# Patient Record
Sex: Male | Born: 2000 | Race: White | Hispanic: No | Marital: Single | Smoking: Never smoker
Health system: Southern US, Community
[De-identification: ages and names within clinical notes are randomized; demographics above are authoritative.]

## PROBLEM LIST (undated history)

## (undated) DIAGNOSIS — R010 Benign and innocent cardiac murmurs: Secondary | ICD-10-CM

## (undated) DIAGNOSIS — F900 Attention-deficit hyperactivity disorder, predominantly inattentive type: Secondary | ICD-10-CM

## (undated) DIAGNOSIS — F913 Oppositional defiant disorder: Secondary | ICD-10-CM

## (undated) HISTORY — DX: Oppositional defiant disorder: F91.3

## (undated) HISTORY — PX: CLAVICLE SURGERY: SHX598

## (undated) HISTORY — DX: Benign and innocent cardiac murmurs: R01.0

## (undated) HISTORY — DX: Attention-deficit hyperactivity disorder, predominantly inattentive type: F90.0

---
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
## 2021-07-24 ENCOUNTER — Other Ambulatory Visit: Payer: Self-pay

## 2021-07-24 DIAGNOSIS — R000 Tachycardia, unspecified: Secondary | ICD-10-CM | POA: Insufficient documentation

## 2021-07-24 DIAGNOSIS — I10 Essential (primary) hypertension: Secondary | ICD-10-CM | POA: Insufficient documentation

## 2021-07-24 DIAGNOSIS — R739 Hyperglycemia, unspecified: Secondary | ICD-10-CM | POA: Insufficient documentation

## 2021-07-24 DIAGNOSIS — F1490 Cocaine use, unspecified, uncomplicated: Secondary | ICD-10-CM | POA: Insufficient documentation

## 2021-07-24 DIAGNOSIS — R42 Dizziness and giddiness: Secondary | ICD-10-CM | POA: Insufficient documentation

## 2021-07-24 DIAGNOSIS — F1910 Other psychoactive substance abuse, uncomplicated: Secondary | ICD-10-CM | POA: Insufficient documentation

## 2021-07-25 ENCOUNTER — Emergency Department (HOSPITAL_BASED_OUTPATIENT_CLINIC_OR_DEPARTMENT_OTHER): Payer: Self-pay

## 2021-07-25 ENCOUNTER — Encounter (HOSPITAL_BASED_OUTPATIENT_CLINIC_OR_DEPARTMENT_OTHER): Payer: Self-pay | Admitting: Emergency Medicine

## 2021-07-25 ENCOUNTER — Other Ambulatory Visit: Payer: Self-pay

## 2021-07-25 ENCOUNTER — Emergency Department (HOSPITAL_BASED_OUTPATIENT_CLINIC_OR_DEPARTMENT_OTHER)
Admission: EM | Admit: 2021-07-25 | Discharge: 2021-07-25 | Disposition: A | Discharge status: Home / Self Care | Payer: Self-pay | Attending: Emergency Medicine | Admitting: Emergency Medicine

## 2021-07-25 DIAGNOSIS — R739 Hyperglycemia, unspecified: Secondary | ICD-10-CM

## 2021-07-25 DIAGNOSIS — F1910 Other psychoactive substance abuse, uncomplicated: Secondary | ICD-10-CM

## 2021-07-25 DIAGNOSIS — F1490 Cocaine use, unspecified, uncomplicated: Secondary | ICD-10-CM

## 2021-07-25 LAB — CBC WITH DIFFERENTIAL/PLATELET
Abs Immature Granulocytes: 0.1 10*3/uL — ABNORMAL HIGH (ref 0.00–0.07)
Basophils Absolute: 0.2 10*3/uL — ABNORMAL HIGH (ref 0.0–0.1)
Basophils Relative: 1 %
Eosinophils Absolute: 0.1 10*3/uL (ref 0.0–0.5)
Eosinophils Relative: 1 %
HCT: 45.4 % (ref 39.0–52.0)
Hemoglobin: 16.1 g/dL (ref 13.0–17.0)
Immature Granulocytes: 1 %
Lymphocytes Relative: 14 %
Lymphs Abs: 1.8 10*3/uL (ref 0.7–4.0)
MCH: 31.6 pg (ref 26.0–34.0)
MCHC: 35.5 g/dL (ref 30.0–36.0)
MCV: 89 fL (ref 80.0–100.0)
Monocytes Absolute: 0.9 10*3/uL (ref 0.1–1.0)
Monocytes Relative: 7 %
Neutro Abs: 9.9 10*3/uL — ABNORMAL HIGH (ref 1.7–7.7)
Neutrophils Relative %: 76 %
Platelets: 355 10*3/uL (ref 150–400)
RBC: 5.1 MIL/uL (ref 4.22–5.81)
RDW: 11.8 % (ref 11.5–15.5)
WBC: 13 10*3/uL — ABNORMAL HIGH (ref 4.0–10.5)
nRBC: 0 % (ref 0.0–0.2)

## 2021-07-25 LAB — BASIC METABOLIC PANEL
Anion gap: 12 (ref 5–15)
BUN: 12 mg/dL (ref 6–20)
CO2: 22 mmol/L (ref 22–32)
Calcium: 9.2 mg/dL (ref 8.9–10.3)
Chloride: 101 mmol/L (ref 98–111)
Creatinine, Ser: 1.1 mg/dL (ref 0.61–1.24)
GFR, Estimated: >60 mL/min (ref >60)
Glucose, Bld: 206 mg/dL — ABNORMAL HIGH (ref 70–99)
Potassium: 2.9 mmol/L — ABNORMAL LOW (ref 3.5–5.1)
Sodium: 135 mmol/L (ref 135–145)

## 2021-07-25 LAB — RAPID URINE DRUG SCREEN, HOSP PERFORMED
Amphetamines: NOT DETECTED
Barbiturates: NOT DETECTED
Benzodiazepines: NOT DETECTED
Cocaine: POSITIVE — AB
Opiates: NOT DETECTED
Tetrahydrocannabinol: NOT DETECTED

## 2021-07-25 LAB — TROPONIN I (HIGH SENSITIVITY)
Troponin I (High Sensitivity): 4 ng/L (ref <18)
Troponin I (High Sensitivity): 11 ng/L (ref <18)

## 2021-07-25 MED ORDER — POTASSIUM CHLORIDE CRYS ER 20 MEQ PO TBCR
80.0000 meq | EXTENDED_RELEASE_TABLET | Freq: Once | ORAL | Status: AC
  Administered 2021-07-25: 80 meq via ORAL
  Filled 2021-07-25: qty 4

## 2021-07-25 MED ORDER — HALOPERIDOL LACTATE 5 MG/ML IJ SOLN
2.0000 mg | Freq: Once | INTRAMUSCULAR | Status: AC
  Administered 2021-07-25: 2 mg via INTRAVENOUS
  Filled 2021-07-25: qty 1

## 2021-07-25 MED ORDER — IOHEXOL 350 MG/ML SOLN
100.0000 mL | Freq: Once | INTRAVENOUS | Status: AC | PRN
  Administered 2021-07-25: 100 mL via INTRAVENOUS

## 2021-07-25 MED ORDER — SODIUM CHLORIDE 0.9 % IV BOLUS
1000.0000 mL | Freq: Once | INTRAVENOUS | Status: AC
  Administered 2021-07-25: 1000 mL via INTRAVENOUS

## 2021-07-25 NOTE — Discharge Instructions (Addendum)
Do not use alcohol or cocaine.  No sugar in your diet until follow up with your doctor.

## 2021-07-25 NOTE — ED Provider Notes (Signed)
[facility] EMERGENCY DEPARTMENT Provider Note   CSN: [id number] Arrival date & time: 07/24/21  2359     History  Chief Complaint  Patient presents with   Hypertension    Kerry Munoz is a 21 y.o. male.  The history is provided by the patient and a parent.  Drug Overdose This is a new problem. The current episode started 1 to 2 hours ago. The problem occurs constantly. The problem has not changed since onset.Pertinent negatives include no chest pain, no abdominal pain, no headaches and no shortness of breath. Nothing aggravates the symptoms. Nothing relieves the symptoms. He has tried nothing for the symptoms. The treatment provided no relief.  Patient who used cocaine and heavy alcohol use in the last 2-3 hours presents with lightheadedness, elevated blood pressure and increased heart rate.  States he believes his cocaine was laced with something because it has not done this in the past with use.      Home Medications Prior to Admission medications   Not on File      Allergies    Penicillins    Review of Systems   Review of Systems  Constitutional:  Negative for fever.  HENT:  Negative for congestion.   Eyes:  Positive for photophobia.  Respiratory:  Negative for shortness of breath.   Cardiovascular:  Negative for chest pain and leg swelling.  Gastrointestinal:  Negative for abdominal pain.  Genitourinary:  Negative for difficulty urinating.  Musculoskeletal:  Negative for back pain.  Neurological:  Positive for light-headedness. Negative for seizures, speech difficulty, weakness, numbness and headaches.  All other systems reviewed and are negative.  Physical Exam Updated Vital Signs BP (!) 146/87    Pulse 80    Temp 97.6 F (36.4 C) (Oral)    Resp 12    Ht 6' (1.829 m)    Wt 72.6 kg    SpO2 100%    BMI 21.70 kg/m  Physical Exam Vitals and nursing note reviewed. Exam conducted with a chaperone present.  Constitutional:      Appearance: Normal  appearance. He is not toxic-appearing.  HENT:     Head: Normocephalic and atraumatic.     Nose: Nose normal.     Mouth/Throat:     Mouth: Mucous membranes are moist.     Pharynx: Oropharynx is clear.  Eyes:     Conjunctiva/sclera: Conjunctivae normal.     Pupils: Pupils are equal, round, and reactive to light.  Cardiovascular:     Rate and Rhythm: Regular rhythm. Tachycardia present.     Pulses: Normal pulses.     Heart sounds: Normal heart sounds.  Pulmonary:     Effort: Pulmonary effort is normal.     Breath sounds: Normal breath sounds.  Abdominal:     General: Abdomen is flat. Bowel sounds are normal.     Palpations: Abdomen is soft.     Tenderness: There is no abdominal tenderness. There is no guarding.  Musculoskeletal:        General: Normal range of motion.     Cervical back: Normal range of motion and neck supple.     Right lower leg: No edema.     Left lower leg: No edema.  Skin:    General: Skin is warm and dry.     Capillary Refill: Capillary refill takes less than 2 seconds.  Neurological:     General: No focal deficit present.     Mental Status: He is alert and oriented to  person, place, and time.     Deep Tendon Reflexes: Reflexes normal.  Psychiatric:        Mood and Affect: Mood normal.        Behavior: Behavior normal.    ED Results / Procedures / Treatments   Labs (all labs ordered are listed, but only abnormal results are displayed) Results for orders placed or performed during the hospital encounter of 07/25/21  CBC with Differential/Platelet  Result Value Ref Range   WBC 13.0 (H) 4.0 - 10.5 K/uL   RBC 5.10 4.22 - 5.81 MIL/uL   Hemoglobin 16.1 13.0 - 17.0 g/dL   HCT 45.4 39.0 - 52.0 %   MCV 89.0 80.0 - 100.0 fL   MCH 31.6 26.0 - 34.0 pg   MCHC 35.5 30.0 - 36.0 g/dL   RDW 11.8 11.5 - 15.5 %   Platelets 355 150 - 400 K/uL   nRBC 0.0 0.0 - 0.2 %   Neutrophils Relative % 76 %   Neutro Abs 9.9 (H) 1.7 - 7.7 K/uL   Lymphocytes Relative 14 %    Lymphs Abs 1.8 0.7 - 4.0 K/uL   Monocytes Relative 7 %   Monocytes Absolute 0.9 0.1 - 1.0 K/uL   Eosinophils Relative 1 %   Eosinophils Absolute 0.1 0.0 - 0.5 K/uL   Basophils Relative 1 %   Basophils Absolute 0.2 (H) 0.0 - 0.1 K/uL   Immature Granulocytes 1 %   Abs Immature Granulocytes 0.10 (H) 0.00 - 0.07 K/uL  Basic metabolic panel  Result Value Ref Range   Sodium 135 135 - 145 mmol/L   Potassium 2.9 (L) 3.5 - 5.1 mmol/L   Chloride 101 98 - 111 mmol/L   CO2 22 22 - 32 mmol/L   Glucose, Bld 206 (H) 70 - 99 mg/dL   BUN 12 6 - 20 mg/dL   Creatinine, Ser 1.10 0.61 - 1.24 mg/dL   Calcium 9.2 8.9 - 10.3 mg/dL   GFR, Estimated >60 >60 mL/min   Anion gap 12 5 - 15  Rapid urine drug screen (hospital performed)  Result Value Ref Range   Opiates NONE DETECTED NONE DETECTED   Cocaine POSITIVE (A) NONE DETECTED   Benzodiazepines NONE DETECTED NONE DETECTED   Amphetamines NONE DETECTED NONE DETECTED   Tetrahydrocannabinol NONE DETECTED NONE DETECTED   Barbiturates NONE DETECTED NONE DETECTED  Troponin I (High Sensitivity)  Result Value Ref Range   Troponin I (High Sensitivity) 4 <18 ng/L  Troponin I (High Sensitivity)  Result Value Ref Range   Troponin I (High Sensitivity) 11 <18 ng/L   CT Angio Chest/Abd/Pel for Dissection W and/or Wo Contrast  Result Date: 07/25/2021 CLINICAL DATA:  Hypertension and dizziness following cocaine use, initial encounter EXAM: CT ANGIOGRAPHY CHEST, ABDOMEN AND PELVIS TECHNIQUE: Non-contrast CT of the chest was initially obtained. Multidetector CT imaging through the chest, abdomen and pelvis was performed using the standard protocol during bolus administration of intravenous contrast. Multiplanar reconstructed images and MIPs were obtained and reviewed to evaluate the vascular anatomy. RADIATION DOSE REDUCTION: This exam was performed according to the departmental dose-optimization program which includes automated exposure control, adjustment of the mA  and/or kV according to patient size and/or use of iterative reconstruction technique. CONTRAST:  18mL OMNIPAQUE IOHEXOL 350 MG/ML SOLN COMPARISON:  None. FINDINGS: CTA CHEST FINDINGS Cardiovascular: Precontrast images demonstrate no aneurysmal dilatation. No hyperdense crescent is identified to suggest acute aortic injury. Post-contrast images demonstrate no aneurysmal dilatation. No dissection is noted. Heart is not significantly  enlarged. Visualized pulmonary artery is within normal limits although not timed for pulmonary embolus evaluation. Mediastinum/Nodes: Thoracic inlet is within normal limits. No sizable hilar or mediastinal adenopathy is noted. The esophagus as visualized is within normal limits. Lungs/Pleura: Lungs are clear. No pleural effusion or pneumothorax. Musculoskeletal: No acute bony abnormality is noted. Review of the MIP images confirms the above findings. CTA ABDOMEN AND PELVIS FINDINGS VASCULAR Aorta: Normal caliber aorta without aneurysm, dissection, vasculitis or significant stenosis. Celiac: Patent without evidence of aneurysm, dissection, vasculitis or significant stenosis. SMA: Patent without evidence of aneurysm, dissection, vasculitis or significant stenosis. Renals: Dual renal arteries are noted on the right. Single renal artery on the left. No focal stenosis is noted. IMA: Patent without evidence of aneurysm, dissection, vasculitis or significant stenosis. Inflow: Iliacs are within normal limits. Veins: No specific venous abnormality is noted. Review of the MIP images confirms the above findings. NON-VASCULAR Hepatobiliary: No focal liver abnormality is seen. No gallstones, gallbladder wall thickening, or biliary dilatation. Pancreas: Unremarkable. No pancreatic ductal dilatation or surrounding inflammatory changes. Spleen: Normal in size without focal abnormality. Adrenals/Urinary Tract: Adrenal glands are within normal limits. Kidneys demonstrate a normal enhancement pattern  bilaterally. Punctate nonobstructing renal calculi are noted bilaterally. The ureters are within normal limits. Bladder is well distended. Stomach/Bowel: The appendix is within normal limits. No obstructive or inflammatory changes of the colon are seen. Small bowel and stomach are within normal limits. Lymphatic: No significant lymphadenopathy is noted. Reproductive: Prostate is unremarkable. Other: No abdominal wall hernia or abnormality. No abdominopelvic ascites. Musculoskeletal: No acute or significant osseous findings. Review of the MIP images confirms the above findings. IMPRESSION: No evidence of aneurysmal dilatation or aortic dissection. Bilateral nonobstructing renal calculi. No acute abnormality to correspond with the patient's given clinical history is noted. Electronically Signed   By: Inez Catalina M.D.   On: 07/25/2021 01:39    EKG  EKG Interpretation  Date/Time:  Monday July 25 2021 00:15:08 EST Ventricular Rate:  128 PR Interval:  133 QRS Duration: 90 QT Interval:  307 QTC Calculation: 448 R Axis:   90 Text Interpretation: Sinus tachycardia Borderline right axis deviation Confirmed by Randal Buba, Sohrab Keelan ([id number]) on 07/25/2021 3:46:12 AM         Radiology CT Angio Chest/Abd/Pel for Dissection W and/or Wo Contrast  Result Date: 07/25/2021 CLINICAL DATA:  Hypertension and dizziness following cocaine use, initial encounter EXAM: CT ANGIOGRAPHY CHEST, ABDOMEN AND PELVIS TECHNIQUE: Non-contrast CT of the chest was initially obtained. Multidetector CT imaging through the chest, abdomen and pelvis was performed using the standard protocol during bolus administration of intravenous contrast. Multiplanar reconstructed images and MIPs were obtained and reviewed to evaluate the vascular anatomy. RADIATION DOSE REDUCTION: This exam was performed according to the departmental dose-optimization program which includes automated exposure control, adjustment of the mA and/or kV according to patient  size and/or use of iterative reconstruction technique. CONTRAST:  147mL OMNIPAQUE IOHEXOL 350 MG/ML SOLN COMPARISON:  None. FINDINGS: CTA CHEST FINDINGS Cardiovascular: Precontrast images demonstrate no aneurysmal dilatation. No hyperdense crescent is identified to suggest acute aortic injury. Post-contrast images demonstrate no aneurysmal dilatation. No dissection is noted. Heart is not significantly enlarged. Visualized pulmonary artery is within normal limits although not timed for pulmonary embolus evaluation. Mediastinum/Nodes: Thoracic inlet is within normal limits. No sizable hilar or mediastinal adenopathy is noted. The esophagus as visualized is within normal limits. Lungs/Pleura: Lungs are clear. No pleural effusion or pneumothorax. Musculoskeletal: No acute bony abnormality is noted. Review of the  MIP images confirms the above findings. CTA ABDOMEN AND PELVIS FINDINGS VASCULAR Aorta: Normal caliber aorta without aneurysm, dissection, vasculitis or significant stenosis. Celiac: Patent without evidence of aneurysm, dissection, vasculitis or significant stenosis. SMA: Patent without evidence of aneurysm, dissection, vasculitis or significant stenosis. Renals: Dual renal arteries are noted on the right. Single renal artery on the left. No focal stenosis is noted. IMA: Patent without evidence of aneurysm, dissection, vasculitis or significant stenosis. Inflow: Iliacs are within normal limits. Veins: No specific venous abnormality is noted. Review of the MIP images confirms the above findings. NON-VASCULAR Hepatobiliary: No focal liver abnormality is seen. No gallstones, gallbladder wall thickening, or biliary dilatation. Pancreas: Unremarkable. No pancreatic ductal dilatation or surrounding inflammatory changes. Spleen: Normal in size without focal abnormality. Adrenals/Urinary Tract: Adrenal glands are within normal limits. Kidneys demonstrate a normal enhancement pattern bilaterally. Punctate nonobstructing  renal calculi are noted bilaterally. The ureters are within normal limits. Bladder is well distended. Stomach/Bowel: The appendix is within normal limits. No obstructive or inflammatory changes of the colon are seen. Small bowel and stomach are within normal limits. Lymphatic: No significant lymphadenopathy is noted. Reproductive: Prostate is unremarkable. Other: No abdominal wall hernia or abnormality. No abdominopelvic ascites. Musculoskeletal: No acute or significant osseous findings. Review of the MIP images confirms the above findings. IMPRESSION: No evidence of aneurysmal dilatation or aortic dissection. Bilateral nonobstructing renal calculi. No acute abnormality to correspond with the patient's given clinical history is noted. Electronically Signed   By: Inez Catalina M.D.   On: 07/25/2021 01:39    Procedures Procedures    Medications Ordered in ED Medications  sodium chloride 0.9 % bolus 1,000 mL (0 mLs Intravenous Stopped 07/25/21 0233)  haloperidol lactate (HALDOL) injection 2 mg (2 mg Intravenous Given 07/25/21 0106)  iohexol (OMNIPAQUE) 350 MG/ML injection 100 mL (100 mLs Intravenous Contrast Given 07/25/21 0127)  potassium chloride SA (KLOR-CON M) CR tablet 80 mEq (80 mEq Oral Given 07/25/21 0240)    ED Course/ Medical Decision Making/ A&P                           Medical Decision Making Patient used cocaine and 6 alcoholic drinks and BP is elevated and feels lightheaded   Problems Addressed: Hyperglycemia: undiagnosed new problem with uncertain prognosis    Details: no sugar in your diet, no alcohol and call your doctor today to be seen for this problem.  Patient and mother verbalize understanding and agree to follow up Polysubstance abuse [facility]):    Details: Stop using alcohol and cocaine  Amount and/or Complexity of Data Reviewed Independent Historian: parent    Details: see above External Data Reviewed: notes.    Details: orthopedics notes from [facility]: ordered.     Details: reviewed by me: normal hemoglobin on CBC, normal platelets, 2 normal troponins, potassium slightly low oral given in ED Radiology: ordered.    Details: No dissection when reviewed by me on CTA ECG/medicine tests: ordered and independent interpretation performed. Decision-making details documented in ED Course.  Risk Prescription drug management. Risk Details: Patient with systemic symptoms from cocaine use and alcohol use.  I have advised patient this presentation is consistent with cocaine effects.  Cocaine can lead to coronary spasm and dissection and accelerated ASCVD, stop use immediately.  Do not use alcohol.  I have advised the patient and his mother of hyperglycemia and need to strict sugar in diet, no alcohol and patient is instructed to call  doctor in am to be seen for hyperglycemia diabetes work up.  Patient and mother verbalize understanding and agree to follow up.  Ruled out for MI in the ED, heart score is 1, ruled out for Dissection in the ED>     Final Clinical Impression(s) / ED Diagnoses Final diagnoses:  Cocaine use  Hyperglycemia  Polysubstance abuse ([place])   None    Return for intractable cough, coughing up blood, fevers > 100.4 unrelieved by medication, shortness of breath, intractable vomiting, chest pain, shortness of breath, weakness, numbness, changes in speech, facial asymmetry, abdominal pain, passing out, Inability to tolerate liquids or food, cough, altered mental status or any concerns. No signs of systemic illness or infection. The patient is nontoxic-appearing on exam and vital signs are within normal limits.  I have reviewed the triage vital signs and the nursing notes. Pertinent labs & imaging results that were available during my care of the patient were reviewed by me and considered in my medical decision making (see chart for details). After history, exam, and medical workup I feel the patient has been appropriately medically screened and is safe for  discharge home. Pertinent diagnoses were discussed with the patient. Patient was given return precautions. Rx / DC Orders ED Discharge Orders     None         Sunshyne Horvath, MD 07/25/21 [id number]

## 2021-07-25 NOTE — ED Triage Notes (Signed)
Reports bp elevated, he feels dizzy after using cocaine tonight.

## 2023-03-16 ENCOUNTER — Other Ambulatory Visit: Payer: Self-pay

## 2023-03-16 ENCOUNTER — Encounter (HOSPITAL_BASED_OUTPATIENT_CLINIC_OR_DEPARTMENT_OTHER): Payer: Self-pay | Admitting: Emergency Medicine

## 2023-03-16 ENCOUNTER — Emergency Department (HOSPITAL_BASED_OUTPATIENT_CLINIC_OR_DEPARTMENT_OTHER): Payer: No Typology Code available for payment source | Admitting: Radiology

## 2023-03-16 DIAGNOSIS — M549 Dorsalgia, unspecified: Secondary | ICD-10-CM | POA: Diagnosis not present

## 2023-03-16 DIAGNOSIS — M25561 Pain in right knee: Secondary | ICD-10-CM | POA: Diagnosis present

## 2023-03-16 DIAGNOSIS — S80211A Abrasion, right knee, initial encounter: Secondary | ICD-10-CM | POA: Insufficient documentation

## 2023-03-16 DIAGNOSIS — Y92410 Unspecified street and highway as the place of occurrence of the external cause: Secondary | ICD-10-CM | POA: Insufficient documentation

## 2023-03-16 DIAGNOSIS — R079 Chest pain, unspecified: Secondary | ICD-10-CM | POA: Diagnosis not present
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
## 2023-03-17 ENCOUNTER — Emergency Department (HOSPITAL_BASED_OUTPATIENT_CLINIC_OR_DEPARTMENT_OTHER)
Admission: EM | Admit: 2023-03-17 | Discharge: 2023-03-17 | Disposition: A | Discharge status: Home / Self Care | Payer: No Typology Code available for payment source | Attending: Emergency Medicine | Admitting: Emergency Medicine

## 2023-03-17 DIAGNOSIS — R079 Chest pain, unspecified: Secondary | ICD-10-CM

## 2023-03-17 MED ORDER — KETOROLAC TROMETHAMINE 15 MG/ML IJ SOLN
15.0000 mg | Freq: Once | INTRAMUSCULAR | Status: DC
  Filled 2023-03-17: qty 1

## 2023-03-17 MED ORDER — IBUPROFEN 800 MG PO TABS
800.0000 mg | ORAL_TABLET | Freq: Once | ORAL | Status: AC
  Administered 2023-03-17: 800 mg via ORAL
  Filled 2023-03-17: qty 1
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
## 2023-03-17 NOTE — Discharge Instructions (Addendum)
Take 4 over the counter ibuprofen tablets 3 times a day or 2 over-the-counter naproxen tablets twice a day for pain. Also take tylenol 1000mg (2 extra strength) four times a day.
